# Patient Record
Sex: Female | Born: 1965 | Hispanic: No | Marital: Married | Smoking: Never smoker
Health system: Southern US, Community
[De-identification: ages and names within clinical notes are randomized; demographics above are authoritative.]

---

## 2017-02-08 ENCOUNTER — Emergency Department: Payer: Worker's Compensation

## 2017-02-08 ENCOUNTER — Encounter: Payer: Self-pay | Admitting: Emergency Medicine

## 2017-02-08 ENCOUNTER — Emergency Department
Admission: EM | Admit: 2017-02-08 | Discharge: 2017-02-08 | Disposition: A | Discharge status: Home / Self Care | Payer: Worker's Compensation | Attending: Emergency Medicine | Admitting: Emergency Medicine

## 2017-02-08 DIAGNOSIS — Y92009 Unspecified place in unspecified non-institutional (private) residence as the place of occurrence of the external cause: Secondary | ICD-10-CM | POA: Diagnosis not present

## 2017-02-08 DIAGNOSIS — Y939 Activity, unspecified: Secondary | ICD-10-CM | POA: Diagnosis not present

## 2017-02-08 DIAGNOSIS — W01198A Fall on same level from slipping, tripping and stumbling with subsequent striking against other object, initial encounter: Secondary | ICD-10-CM | POA: Diagnosis not present

## 2017-02-08 DIAGNOSIS — S8001XA Contusion of right knee, initial encounter: Secondary | ICD-10-CM

## 2017-02-08 DIAGNOSIS — Y999 Unspecified external cause status: Secondary | ICD-10-CM | POA: Insufficient documentation

## 2017-02-08 DIAGNOSIS — S8991XA Unspecified injury of right lower leg, initial encounter: Secondary | ICD-10-CM | POA: Diagnosis present

## 2017-02-08 DIAGNOSIS — W19XXXA Unspecified fall, initial encounter: Secondary | ICD-10-CM

## 2017-02-08 MED ORDER — ONDANSETRON HCL 4 MG/2ML IJ SOLN
4.0000 mg | Freq: Once | INTRAMUSCULAR | Status: AC
  Administered 2017-02-08: 4 mg via INTRAVENOUS

## 2017-02-08 MED ORDER — MORPHINE SULFATE (PF) 4 MG/ML IV SOLN
4.0000 mg | Freq: Once | INTRAVENOUS | Status: AC
  Administered 2017-02-08: 4 mg via INTRAVENOUS
  Filled 2017-02-08: qty 1

## 2017-02-08 MED ORDER — ONDANSETRON HCL 4 MG/2ML IJ SOLN
INTRAMUSCULAR | Status: AC
  Administered 2017-02-08: 4 mg via INTRAVENOUS
  Filled 2017-02-08: qty 2

## 2017-02-08 MED ORDER — NAPROXEN 375 MG PO TABS: 375.0000 mg | ORAL_TABLET | Freq: Two times a day (BID) | ORAL | 0 refills | Status: AC

## 2017-02-08 NOTE — ED Notes (Signed)
UDS sent per WC profile. Judeth CornfieldStephanie, EDT walked UDS to lab and completed paperwork with pt and [place] Nation[place]ir[place], interpreter

## 2017-02-08 NOTE — ED Notes (Signed)
Paper d/c signature placed in records

## 2017-02-08 NOTE — ED Notes (Signed)
ED Provider at bedside. 

## 2017-02-08 NOTE — ED Notes (Signed)

## 2017-02-08 NOTE — ED Provider Notes (Addendum)
[facility] Emergency Department Provider Note  ____________________________________________   I have reviewed the triage vital signs and the nursing notes. Where available I have reviewed prior notes and, if possible and indicated, outside hospital notes.    HISTORY  Chief Complaint Fall and Knee Pain    HPI Kayla Acevedo is a 52 y.o. female who is healthy at baseline tripped at home landed on her right knee did not pass out, no other injury besides knee pain, unable to walk on it.  Came in by ambulance.  Not taking medications has no allergies.   Location: Right knee Radiation: None Quality:  sharp Duration:  1 hour Timing:  na Severity: significant  Associated sxs:  Hurts to move PriorTreatment : none   History reviewed. No pertinent past medical history.  There are no active problems to display for this patient.   History reviewed. No pertinent surgical history.  Prior to Admission medications   Not on File    Allergies Patient has no known allergies.  History reviewed. No pertinent family history.  Social History Social History   Tobacco Use  . Smoking status: Never Smoker  . Smokeless tobacco: Never Used  Substance Use Topics  . Alcohol use: Not on file  . Drug use: Not on file    Review of Systems Constitutional: No fever/chills Eyes: No visual changes. ENT: No sore throat. No stiff neck no neck pain Cardiovascular: Denies chest pain. Respiratory: Denies shortness of breath. Gastrointestinal:   no vomiting.  No diarrhea.  No constipation. Genitourinary: Negative for dysuria. Musculoskeletal: Negative lower extremity swelling Skin: Negative for rash. Neurological: Negative for severe headaches, focal weakness or numbness.   ____________________________________________   PHYSICAL EXAM:  VITAL SIGNS: ED Triage Vitals  Enc Vitals Group     BP 02/08/17 2135 (!) 153/88     Pulse Rate 02/08/17 2135 63     Resp  02/08/17 2135 20     Temp 02/08/17 2135 (!) 97.5 F (36.4 C)     Temp Source 02/08/17 2135 Oral     SpO2 02/08/17 2135 97 %     Weight 02/08/17 2139 150 lb (68 kg)     Height 02/08/17 2139 5\' 2"  (1.575 m)     Head Circumference --      Peak Flow --      Pain Score --      Pain Loc --      Pain Edu? --      Excl. in GC? --     Constitutional: Alert and oriented. Well appearing and in no acute distress. Eyes: Conjunctivae are normal Head: Atraumatic HEENT: No congestion/rhinnorhea. Mucous membranes are moist.  Oropharynx non-erythematous Neck:   Nontender with no meningismus, no masses, no stridor Cardiovascular: Normal rate, regular rhythm. Grossly normal heart sounds.  Good peripheral circulation. Respiratory: Normal respiratory effort.  No retractions. Lungs CTAB. Abdominal: Soft and nontender. No distention. No guarding no rebound Back:  There is no focal tenderness or step off.  there is no midline tenderness there are no lesions noted. there is no CVA tenderness Musculoskeletal: Pain to palpation of the right knee, possibly some slight swelling, no bruising noted, painful range of motion.  Unable to assess ligamentous stability given discomfort.  Distal pulses are present.  No ankle or hip pain noted.  No upper extremity tenderness. No joint effusions, no DVT signs strong distal pulses no edema Neurologic:  Normal speech and language. No gross focal neurologic deficits are appreciated.  Skin:  Skin is warm, dry and intact. No rash noted. Psychiatric: Mood and affect are normal. Speech and behavior are normal.  ____________________________________________   LABS (all labs ordered are listed, but only abnormal results are displayed)  Labs Reviewed - No data to display  Pertinent labs  results that were available during my care of the patient were reviewed by me and considered in my medical decision making (see chart for  details). ____________________________________________  EKG  I personally interpreted any EKGs ordered by me or triage  ____________________________________________  RADIOLOGY  Pertinent labs & imaging results that were available during my care of the patient were reviewed by me and considered in my medical decision making (see chart for details). If possible, patient and/or family made aware of any abnormal findings.  No results found. ____________________________________________    PROCEDURES  Procedure(s) performed: None  Procedures  Critical Care performed: None  ____________________________________________   INITIAL IMPRESSION / ASSESSMENT AND PLAN / ED COURSE  Pertinent labs & imaging results that were available during my care of the patient were reviewed by me and considered in my medical decision making (see chart for details).  Here after non-syncopal fall, tripped, has knee pain, will give her pain medication obtain imaging and reassess   ----------------------------------------- 10:35 PM on 02/08/2017 -----------------------------------------  Tertiary exam shows no evidence of acute hip or ankle discomfort strong distal pulses no evidence of dislocation and relocation or disc dislocation in general.  Patient with knee pain, but no evidence of acute fracture etc.  Questionable foreign body seen on x-ray, there is no break in the skin from this injury as I do not think that is from today if it was ever there.  I have advised the patient of the finding.  Patient was seen with interpreter.   ____________________________________________   FINAL CLINICAL IMPRESSION(S) / ED DIAGNOSES  Final diagnoses:  None      This chart was dictated using voice recognition software.  Despite best efforts to proofread,  errors can occur which can change meaning.      Jeanmarie Plant, MD 02/08/17 2152    Jeanmarie Plant, MD 02/08/17 2236

## 2017-02-08 NOTE — ED Notes (Signed)
Pt WC completed by this tech and walked down to lab and signed in @ 2328

## 2017-02-08 NOTE — ED Notes (Signed)
This RN attempt to contact #listed on WC Profile x3 without answer-calling regarding necessity of breath analysis (profile states upon request). UDS will be collected as profile requests.

## 2017-02-08 NOTE — ED Triage Notes (Signed)
Pt arrived to ED via EMS from work where EMS reports she tripped over piece of wood, landing on her right knee. Pt c/o right knee pain. Swelling and bruising noted to right knee on arrival.

## 2019-02-23 IMAGING — DX DG KNEE COMPLETE 4+V*R*
4 series · 4 of 4 positions shown · non-contrast
Comparison: None.

CLINICAL DATA: Right knee pain after injury

EXAM:
RIGHT KNEE - COMPLETE 4+ VIEW

[knee ap]
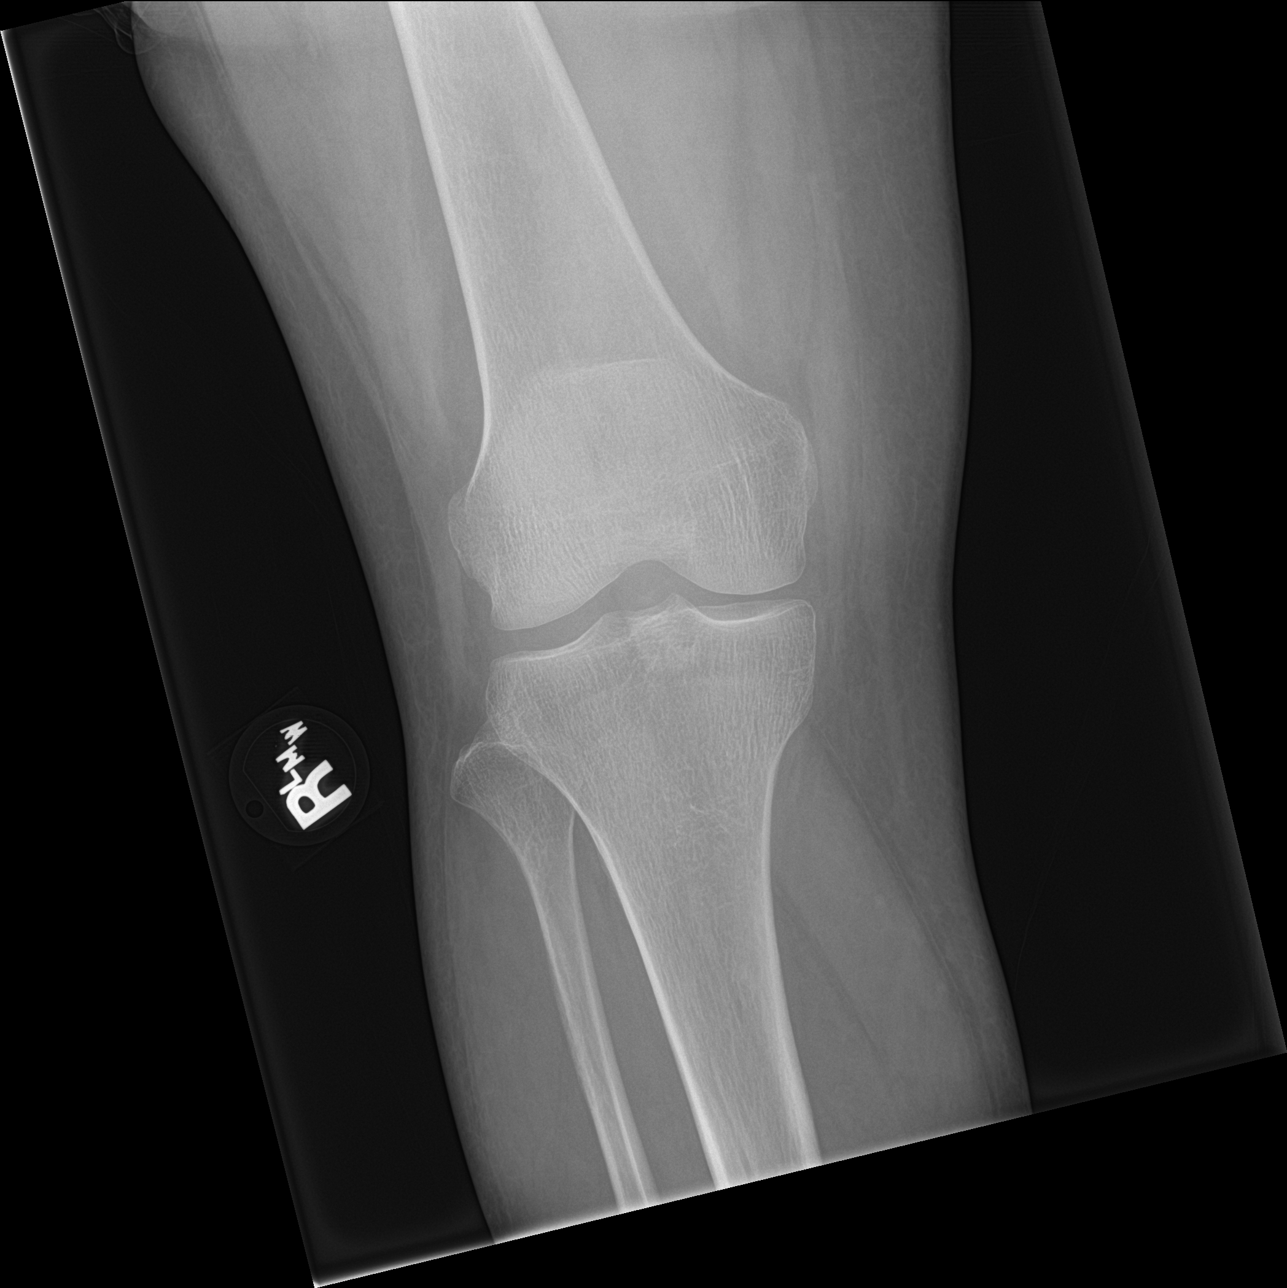

[knee lat]
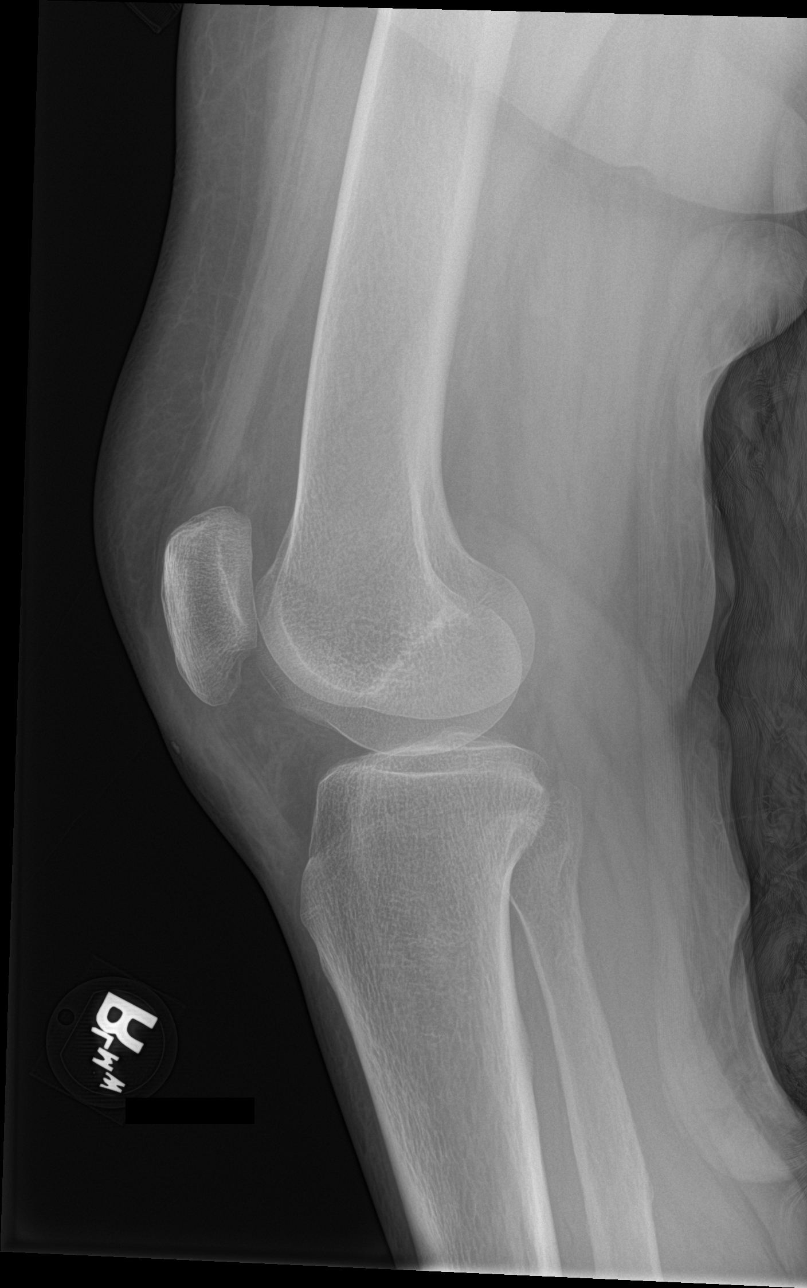

[knee obl (1 of 2)]
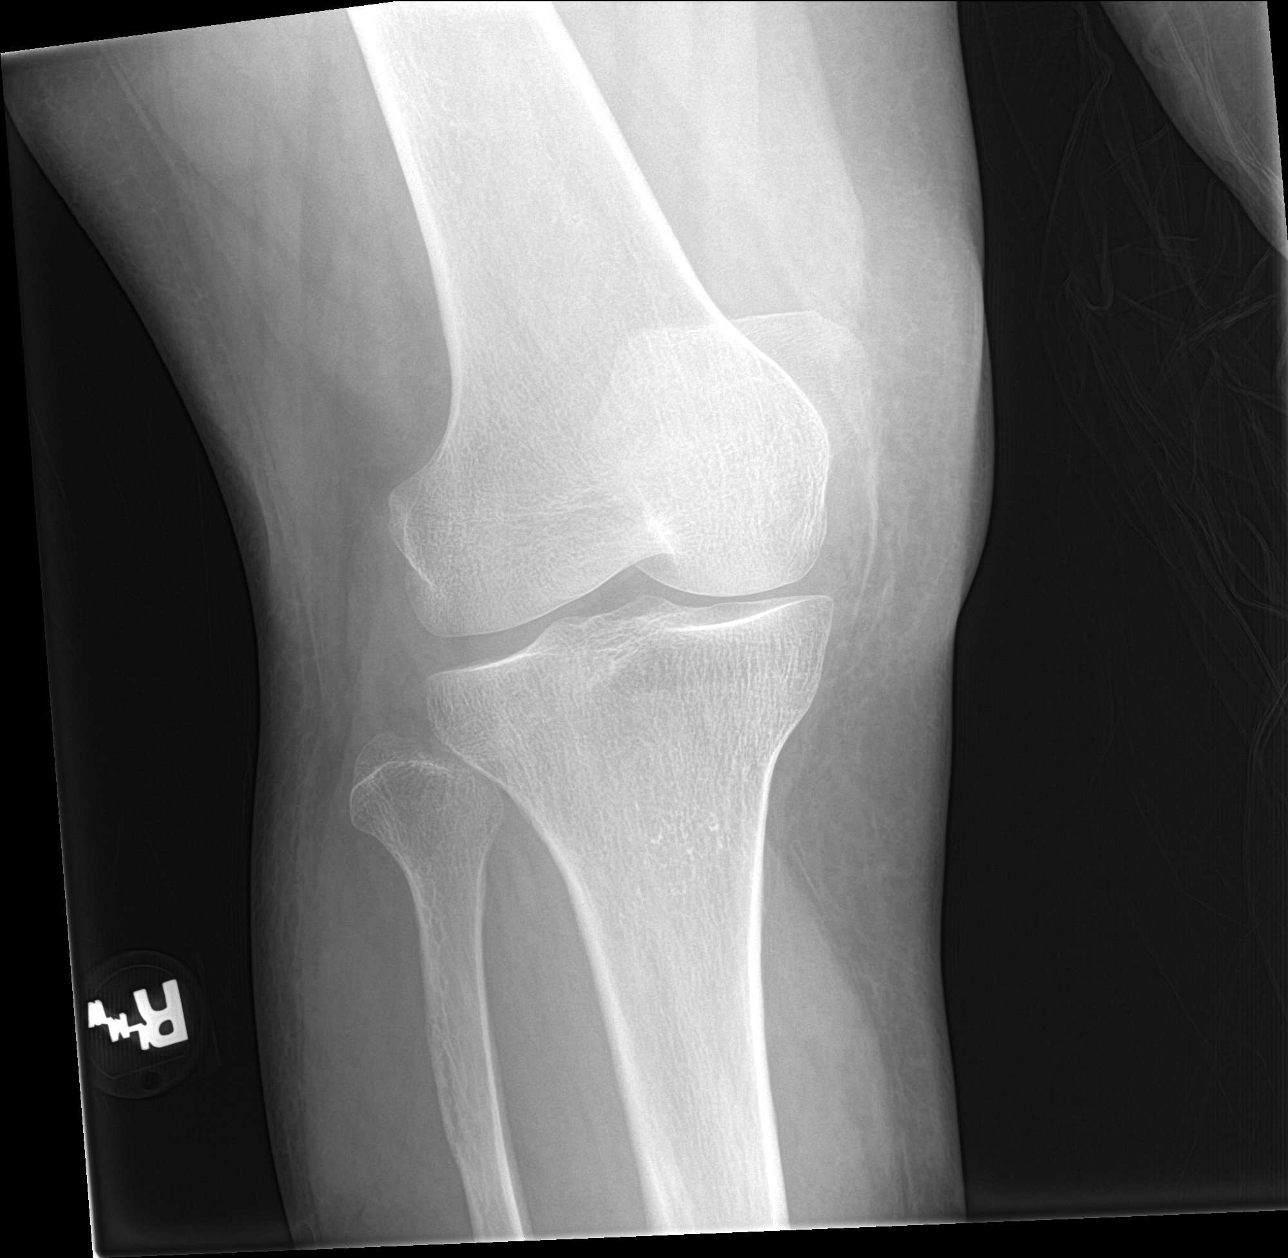

[knee obl (2 of 2)]
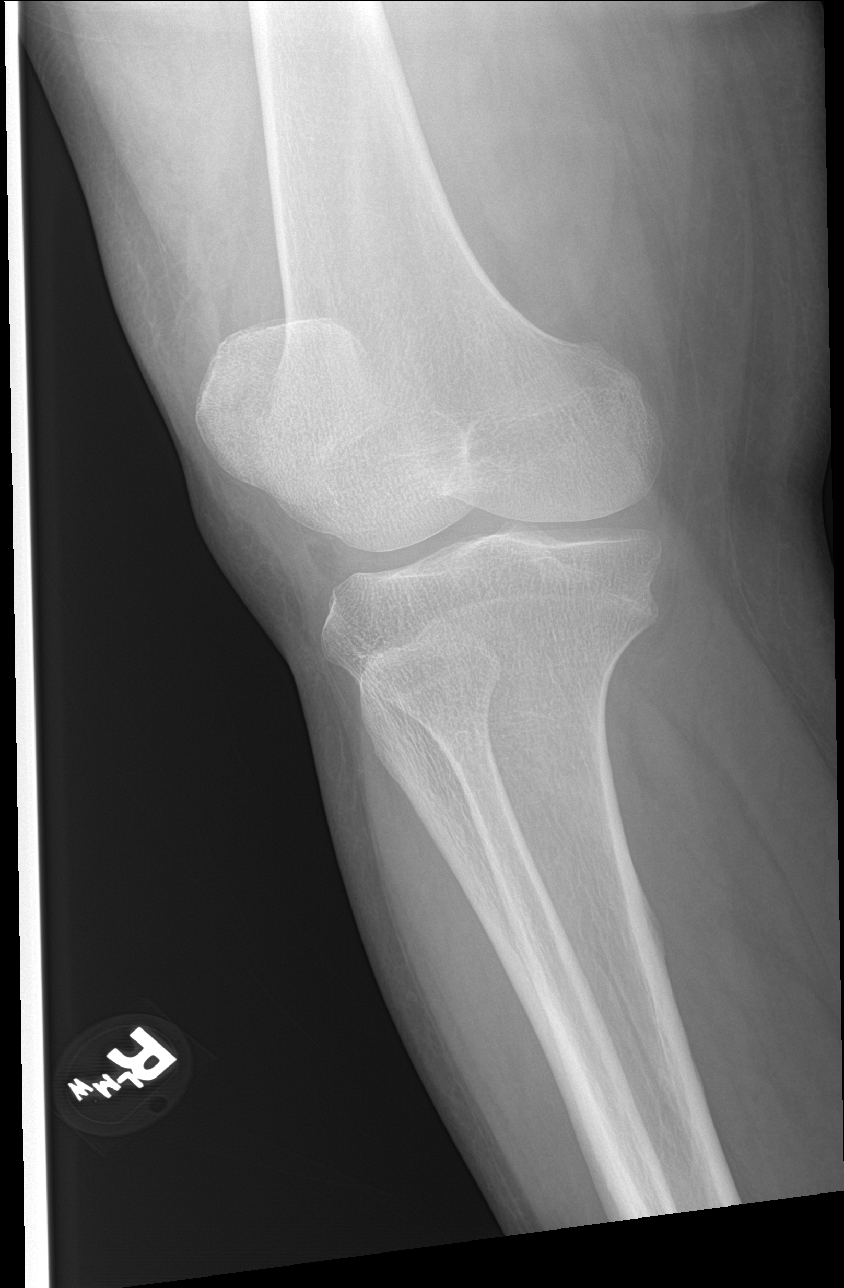

[4 of 4 positions shown; findings below may reference images not displayed]

FINDINGS: Prepatellar soft tissue swelling. Tiny subcutaneous 3 mm linear
density in the prepatellar region. No joint effusion, fracture or
dislocation. No appreciable arthropathy. No suspicious focal osseous
lesions.
IMPRESSION: 1. No joint effusion, fracture or dislocation.
2. Prepatellar soft tissue swelling. Tiny subcutaneous 3 mm linear
density in the prepatellar space, cannot exclude a tiny foreign
body.
